# Patient Record
Sex: Male | Born: 1975 | Race: Black or African American | Hispanic: No | Marital: Single | Smoking: Current every day smoker
Health system: Southern US, Community
[De-identification: ages and names within clinical notes are randomized; demographics above are authoritative.]

## PROBLEM LIST (undated history)

## (undated) DIAGNOSIS — Z789 Other specified health status: Secondary | ICD-10-CM

---

## 2003-05-27 ENCOUNTER — Emergency Department (HOSPITAL_COMMUNITY): Admission: AC | Admit: 2003-05-27 | Discharge: 2003-05-27 | Payer: Self-pay

## 2003-06-03 ENCOUNTER — Emergency Department (HOSPITAL_COMMUNITY): Admission: EM | Admit: 2003-06-03 | Discharge: 2003-06-03 | Payer: Self-pay

## 2003-12-22 ENCOUNTER — Emergency Department (HOSPITAL_COMMUNITY): Admission: EM | Admit: 2003-12-22 | Discharge: 2003-12-22 | Payer: Self-pay | Admitting: Emergency Medicine

## 2004-07-18 ENCOUNTER — Emergency Department (HOSPITAL_COMMUNITY): Admission: EM | Admit: 2004-07-18 | Discharge: 2004-07-18 | Payer: Self-pay | Admitting: Emergency Medicine

## 2006-04-14 ENCOUNTER — Emergency Department (HOSPITAL_COMMUNITY): Admission: EM | Admit: 2006-04-14 | Discharge: 2006-04-14 | Payer: Self-pay | Admitting: Family Medicine

## 2006-11-04 ENCOUNTER — Emergency Department (HOSPITAL_COMMUNITY): Admission: EM | Admit: 2006-11-04 | Discharge: 2006-11-04 | Payer: Self-pay | Admitting: Emergency Medicine

## 2006-11-19 ENCOUNTER — Emergency Department (HOSPITAL_COMMUNITY): Admission: EM | Admit: 2006-11-19 | Discharge: 2006-11-19 | Payer: Self-pay | Admitting: Family Medicine

## 2006-12-21 ENCOUNTER — Ambulatory Visit: Payer: Self-pay | Admitting: Internal Medicine

## 2006-12-21 ENCOUNTER — Ambulatory Visit: Payer: Self-pay | Admitting: *Deleted

## 2007-01-22 DIAGNOSIS — K089 Disorder of teeth and supporting structures, unspecified: Secondary | ICD-10-CM | POA: Insufficient documentation

## 2008-04-16 ENCOUNTER — Emergency Department (HOSPITAL_COMMUNITY): Admission: EM | Admit: 2008-04-16 | Discharge: 2008-04-16 | Payer: Self-pay | Admitting: Emergency Medicine

## 2008-07-05 ENCOUNTER — Emergency Department (HOSPITAL_COMMUNITY): Admission: EM | Admit: 2008-07-05 | Discharge: 2008-07-05 | Payer: Self-pay | Admitting: Emergency Medicine

## 2010-03-15 ENCOUNTER — Emergency Department (HOSPITAL_COMMUNITY): Admission: EM | Admit: 2010-03-15 | Discharge: 2010-03-15 | Payer: Self-pay | Admitting: Emergency Medicine

## 2011-08-08 ENCOUNTER — Encounter (HOSPITAL_COMMUNITY): Payer: Self-pay | Admitting: *Deleted

## 2011-08-08 ENCOUNTER — Ambulatory Visit (HOSPITAL_BASED_OUTPATIENT_CLINIC_OR_DEPARTMENT_OTHER): Payer: Self-pay | Admitting: Anesthesiology

## 2011-08-08 ENCOUNTER — Encounter (HOSPITAL_BASED_OUTPATIENT_CLINIC_OR_DEPARTMENT_OTHER)
Admission: RE | Disposition: A | Discharge status: Home / Self Care | Payer: Self-pay | Source: Ambulatory Visit | Attending: Orthopedic Surgery

## 2011-08-08 ENCOUNTER — Encounter (HOSPITAL_BASED_OUTPATIENT_CLINIC_OR_DEPARTMENT_OTHER): Payer: Self-pay | Admitting: Anesthesiology

## 2011-08-08 ENCOUNTER — Ambulatory Visit (HOSPITAL_BASED_OUTPATIENT_CLINIC_OR_DEPARTMENT_OTHER)
Admission: RE | Admit: 2011-08-08 | Discharge: 2011-08-08 | Disposition: A | Discharge status: Home / Self Care | Payer: Self-pay | Source: Ambulatory Visit | Attending: Orthopedic Surgery | Admitting: Orthopedic Surgery

## 2011-08-08 ENCOUNTER — Encounter (HOSPITAL_BASED_OUTPATIENT_CLINIC_OR_DEPARTMENT_OTHER): Payer: Self-pay | Admitting: Orthopedic Surgery

## 2011-08-08 ENCOUNTER — Emergency Department (HOSPITAL_COMMUNITY)
Admission: EM | Admit: 2011-08-08 | Discharge: 2011-08-08 | Disposition: A | Discharge status: Home / Self Care | Payer: Self-pay | Attending: Emergency Medicine | Admitting: Emergency Medicine

## 2011-08-08 ENCOUNTER — Emergency Department (HOSPITAL_COMMUNITY): Payer: Self-pay

## 2011-08-08 DIAGNOSIS — W208XXA Other cause of strike by thrown, projected or falling object, initial encounter: Secondary | ICD-10-CM | POA: Insufficient documentation

## 2011-08-08 DIAGNOSIS — IMO0002 Reserved for concepts with insufficient information to code with codable children: Secondary | ICD-10-CM | POA: Insufficient documentation

## 2011-08-08 DIAGNOSIS — S68616A Complete traumatic transphalangeal amputation of right little finger, initial encounter: Secondary | ICD-10-CM

## 2011-08-08 HISTORY — PX: AMPUTATION: SHX166

## 2011-08-08 HISTORY — DX: Other specified health status: Z78.9

## 2011-08-08 SURGERY — AMPUTATION DIGIT
Anesthesia: Monitor Anesthesia Care | Site: Finger | Laterality: Left | Wound class: Contaminated

## 2011-08-08 MED ORDER — MIDAZOLAM HCL 5 MG/5ML IJ SOLN
INTRAMUSCULAR | Status: DC | PRN
  Administered 2011-08-08: 2 mg via INTRAVENOUS

## 2011-08-08 MED ORDER — BUPIVACAINE HCL (PF) 0.25 % IJ SOLN
INTRAMUSCULAR | Status: DC | PRN
  Administered 2011-08-08: 10 mL

## 2011-08-08 MED ORDER — PROPOFOL 10 MG/ML IV EMUL
INTRAVENOUS | Status: DC | PRN
  Administered 2011-08-08: 75 ug/kg/min via INTRAVENOUS

## 2011-08-08 MED ORDER — CEPHALEXIN 500 MG PO CAPS: 500.0000 mg | ORAL_CAPSULE | Freq: Four times a day (QID) | ORAL | Status: AC

## 2011-08-08 MED ORDER — CHLORHEXIDINE GLUCONATE 4 % EX LIQD: 60.0000 mL | Freq: Once | CUTANEOUS | Status: DC

## 2011-08-08 MED ORDER — TETANUS-DIPHTH-ACELL PERTUSSIS 5-2.5-18.5 LF-MCG/0.5 IM SUSP
0.5000 mL | Freq: Once | INTRAMUSCULAR | Status: AC
  Administered 2011-08-08: 0.5 mL via INTRAMUSCULAR
  Filled 2011-08-08: qty 0.5

## 2011-08-08 MED ORDER — OXYCODONE-ACETAMINOPHEN 5-325 MG PO TABS: ORAL_TABLET | ORAL | Status: AC

## 2011-08-08 MED ORDER — HYDROMORPHONE HCL PF 1 MG/ML IJ SOLN: 0.2500 mg | INTRAMUSCULAR | Status: DC | PRN

## 2011-08-08 MED ORDER — FENTANYL CITRATE 0.05 MG/ML IJ SOLN
INTRAMUSCULAR | Status: DC | PRN
  Administered 2011-08-08: 100 ug via INTRAVENOUS

## 2011-08-08 MED ORDER — OXYCODONE-ACETAMINOPHEN 5-325 MG PO TABS
1.0000 | ORAL_TABLET | Freq: Once | ORAL | Status: AC
  Administered 2011-08-08: 1 via ORAL
  Filled 2011-08-08: qty 1

## 2011-08-08 MED ORDER — CEFAZOLIN SODIUM 1-5 GM-% IV SOLN: 1.0000 g | INTRAVENOUS | Status: DC

## 2011-08-08 MED ORDER — OXYCODONE-ACETAMINOPHEN 5-325 MG PO TABS: 1.0000 | ORAL_TABLET | ORAL | Status: AC | PRN

## 2011-08-08 MED ORDER — CEPHALEXIN 250 MG PO CAPS
500.0000 mg | ORAL_CAPSULE | Freq: Once | ORAL | Status: AC
  Administered 2011-08-08: 500 mg via ORAL
  Filled 2011-08-08: qty 2

## 2011-08-08 MED ORDER — BUPIVACAINE HCL 0.25 % IJ SOLN
20.0000 mL | Freq: Once | INTRAMUSCULAR | Status: DC
  Filled 2011-08-08: qty 20

## 2011-08-08 MED ORDER — LACTATED RINGERS IV SOLN
INTRAVENOUS | Status: DC
  Administered 2011-08-08: 10:00:00 via INTRAVENOUS

## 2011-08-08 MED ORDER — ONDANSETRON HCL 4 MG/2ML IJ SOLN
INTRAMUSCULAR | Status: DC | PRN
  Administered 2011-08-08: 4 mg via INTRAVENOUS

## 2011-08-08 SURGICAL SUPPLY — 54 items
BANDAGE ELASTIC 3 VELCRO ST LF (GAUZE/BANDAGES/DRESSINGS) IMPLANT
BANDAGE GAUZE 4  KLING STR (GAUZE/BANDAGES/DRESSINGS) ×1 IMPLANT
BANDAGE GAUZE STRT 1 STR LF (GAUZE/BANDAGES/DRESSINGS) IMPLANT
BLADE MINI RND TIP GREEN BEAV (BLADE) IMPLANT
BLADE SURG 15 STRL LF DISP TIS (BLADE) ×2 IMPLANT
BLADE SURG 15 STRL SS (BLADE) ×4
BNDG CMPR 9X4 STRL LF SNTH (GAUZE/BANDAGES/DRESSINGS) ×1
BNDG CMPR MD 5X2 ELC HKLP STRL (GAUZE/BANDAGES/DRESSINGS)
BNDG COHESIVE 1X5 TAN STRL LF (GAUZE/BANDAGES/DRESSINGS) ×2 IMPLANT
BNDG ELASTIC 2 VLCR STRL LF (GAUZE/BANDAGES/DRESSINGS) IMPLANT
BNDG ESMARK 4X9 LF (GAUZE/BANDAGES/DRESSINGS) ×1 IMPLANT
CHLORAPREP W/TINT 26ML (MISCELLANEOUS) ×2 IMPLANT
CLOTH BEACON ORANGE TIMEOUT ST (SAFETY) ×2 IMPLANT
CORDS BIPOLAR (ELECTRODE) ×2 IMPLANT
COVER MAYO STAND STRL (DRAPES) ×2 IMPLANT
COVER TABLE BACK 60X90 (DRAPES) ×2 IMPLANT
DECANTER SPIKE VIAL GLASS SM (MISCELLANEOUS) ×1 IMPLANT
DRAIN PENROSE 1/4X12 LTX STRL (WOUND CARE) ×1 IMPLANT
DRAPE EXTREMITY T 121X128X90 (DRAPE) ×2 IMPLANT
DRAPE OEC MINIVIEW 54X84 (DRAPES) IMPLANT
DRAPE SURG 17X23 STRL (DRAPES) ×2 IMPLANT
GAUZE SPONGE 4X4 12PLY STRL LF (GAUZE/BANDAGES/DRESSINGS) IMPLANT
GAUZE XEROFORM 1X8 LF (GAUZE/BANDAGES/DRESSINGS) ×2 IMPLANT
GLOVE BIO SURGEON STRL SZ 6.5 (GLOVE) ×1 IMPLANT
GLOVE BIO SURGEON STRL SZ7.5 (GLOVE) ×2 IMPLANT
GLOVE BIOGEL PI IND STRL 7.0 (GLOVE) IMPLANT
GLOVE BIOGEL PI IND STRL 8 (GLOVE) ×1 IMPLANT
GLOVE BIOGEL PI INDICATOR 7.0 (GLOVE) ×1
GLOVE BIOGEL PI INDICATOR 8 (GLOVE) ×1
GOWN PREVENTION PLUS XLARGE (GOWN DISPOSABLE) ×2 IMPLANT
GOWN PREVENTION PLUS XXLARGE (GOWN DISPOSABLE) ×2 IMPLANT
GOWN STRL REIN XL XLG (GOWN DISPOSABLE) ×2 IMPLANT
NS IRRIG 1000ML POUR BTL (IV SOLUTION) ×2 IMPLANT
PACK BASIN DAY SURGERY FS (CUSTOM PROCEDURE TRAY) ×2 IMPLANT
PADDING CAST ABS 4INX4YD NS (CAST SUPPLIES) ×1
PADDING CAST ABS COTTON 4X4 ST (CAST SUPPLIES) ×1 IMPLANT
SPLINT FNGR BALL END 5/8X4.25 (SOFTGOODS) IMPLANT
SPLINT FNGR PLAIN END 5/8X3.25 (CAST SUPPLIES) IMPLANT
SPLINT PLASTALUME 3 1/4 (CAST SUPPLIES) ×2
SPLINT PLASTALUME BALL 4 1/4IN (SOFTGOODS)
SPONGE GAUZE 4X4 12PLY (GAUZE/BANDAGES/DRESSINGS) ×2 IMPLANT
STOCKINETTE 4X48 STRL (DRAPES) ×2 IMPLANT
SUT CHROMIC 4 0 P 3 18 (SUTURE) IMPLANT
SUT ETHILON 3 0 PS 1 (SUTURE) IMPLANT
SUT ETHILON 4 0 PS 2 18 (SUTURE) IMPLANT
SUT MERSILENE 4 0 P 3 (SUTURE) IMPLANT
SUT MON AB 5-0 P3 18 (SUTURE) ×1 IMPLANT
SUT VIC AB 4-0 P-3 18XBRD (SUTURE) IMPLANT
SUT VIC AB 4-0 P3 18 (SUTURE)
SYR BULB 3OZ (MISCELLANEOUS) ×2 IMPLANT
SYR CONTROL 10ML LL (SYRINGE) ×1 IMPLANT
TOWEL OR 17X24 6PK STRL BLUE (TOWEL DISPOSABLE) ×2 IMPLANT
UNDERPAD 30X30 INCONTINENT (UNDERPADS AND DIAPERS) ×2 IMPLANT
WATER STERILE IRR 1000ML POUR (IV SOLUTION) ×2 IMPLANT

## 2011-08-08 NOTE — Brief Op Note (Signed)
08/08/2011  11:57 AM  PATIENT:  Jeremiah Horton  36 y.o. male  PRE-OPERATIVE DIAGNOSIS:  FINGER TIP AMPUTATION  POST-OPERATIVE DIAGNOSIS:  Right Small Fingertip Amputation  PROCEDURE:  Procedure(s) (LRB): AMPUTATION DIGIT (Left)  SURGEON:  Surgeon(s) and Role:    * Tami Ribas, MD - Primary  PHYSICIAN ASSISTANT:   ASSISTANTS: none   ANESTHESIA:   local and MAC  EBL:     BLOOD ADMINISTERED:none  DRAINS: none   LOCAL MEDICATIONS USED:  MARCAINE     SPECIMEN:  No Specimen  DISPOSITION OF SPECIMEN:  N/A  COUNTS:  YES  TOURNIQUET:  * Missing tourniquet times found for documented tourniquets in log:  34500 *  DICTATION: .Other Dictation: Dictation Number [id number]  PLAN OF CARE: Discharge to home after PACU  PATIENT DISPOSITION:  PACU - hemodynamically stable.

## 2011-08-08 NOTE — Transfer of Care (Signed)
Immediate Anesthesia Transfer of Care Note  Patient: Jeremiah Horton  Procedure(s) Performed: Procedure(s) (LRB): AMPUTATION DIGIT (Left)  Patient Location: PACU  Anesthesia Type: MAC  Level of Consciousness: awake  Airway & Oxygen Therapy: Patient Spontanous Breathing and Patient connected to face mask oxygen  Post-op Assessment: Report given to PACU RN and Post -op Vital signs reviewed and stable  Post vital signs: Reviewed and stable  Complications: No apparent anesthesia complications

## 2011-08-08 NOTE — Op Note (Signed)
Dictation [id number]

## 2011-08-08 NOTE — ED Notes (Signed)
Patient states he was reaching under the hood of the car and cut his pinky finger on the right hand.  Dressing on at this time

## 2011-08-08 NOTE — Anesthesia Preprocedure Evaluation (Signed)
Anesthesia Evaluation  Patient identified by MRN, date of birth, ID band Patient awake    Reviewed: Allergy & Precautions, H&P , NPO status , Patient's Chart, lab work & pertinent test results  History of Anesthesia Complications Negative for: history of anesthetic complications  Airway Mallampati: II TM Distance: >3 FB Neck ROM: Full    Dental No notable dental hx. (+) Teeth Intact, Implants and Dental Advisory Given   Pulmonary Current Smoker,  breath sounds clear to auscultation  Pulmonary exam normal       Cardiovascular negative cardio ROS  Rhythm:Regular Rate:Normal     Neuro/Psych negative neurological ROS  negative psych ROS   GI/Hepatic negative GI ROS, Neg liver ROS,   Endo/Other  negative endocrine ROS  Renal/GU negative Renal ROS     Musculoskeletal   Abdominal   Peds  Hematology negative hematology ROS (+)   Anesthesia Other Findings   Reproductive/Obstetrics                           Anesthesia Physical Anesthesia Plan  ASA: II  Anesthesia Plan: MAC   Post-op Pain Management:    Induction:   Airway Management Planned: Simple Face Mask  Additional Equipment:   Intra-op Plan:   Post-operative Plan:   Informed Consent: I have reviewed the patients History and Physical, chart, labs and discussed the procedure including the risks, benefits and alternatives for the proposed anesthesia with the patient or authorized representative who has indicated his/her understanding and acceptance.   Dental advisory given  Plan Discussed with: CRNA and Surgeon  Anesthesia Plan Comments: (Plan routine monitors, MAC)        Anesthesia Quick Evaluation

## 2011-08-08 NOTE — Discharge Instructions (Addendum)

## 2011-08-08 NOTE — H&P (Signed)
  Jeremiah Horton is an 36 y.o. male.   Chief Complaint: right small fingertip amputation HPI: 36 yo male amputated tip of right small finger in radiator fan of his car last night.  Seen at [facility] where wound was cleaned and dressed.  Presents to surgery center for care.  Reports no previous injury to right small finger and no other injury at this time.    No past medical history on file.  No past surgical history on file.  No family history on file. Social History:  reports that he has been smoking.  He does not have any smokeless tobacco history on file. He reports that he drinks alcohol. He reports that he does not use illicit drugs.  Allergies: No Known Allergies  Medications Prior to Admission  Medication Dose Route Frequency Provider Last Rate Last Dose  . cephALEXin (KEFLEX) capsule 500 mg  500 mg Oral Once Nat Christen, MD   500 mg at 08/08/11 0500  . oxyCODONE-acetaminophen (PERCOCET) 5-325 MG per tablet 1 tablet  1 tablet Oral Once Nat Christen, MD   1 tablet at 08/08/11 0420  . TDaP (BOOSTRIX) injection 0.5 mL  0.5 mL Intramuscular Once Nat Christen, MD   0.5 mL at 08/08/11 0421  . DISCONTD: bupivacaine (MARCAINE) 0.25 % (with pres) injection 20 mL  20 mL Other Once Nat Christen, MD       No current outpatient prescriptions on file as of 08/08/2011.    No results found for this or any previous visit (from the past 48 hour(s)).  Dg Finger Little Right  08/08/2011  *RADIOLOGY REPORT*  Clinical Data: Amputation of the distal tip of the right fifth finger.  RIGHT LITTLE FINGER 2+V  Comparison: None.  Findings: There is avulsion of a portion of the distal tuft of the fifth distal phalanx; the distal fragment is not seen on this study.  Associated loss of the overlying soft tissues is noted.  Visualized joint spaces are preserved.  No additional fractures are seen.  IMPRESSION: Avulsion of a portion of the distal tuft of the fifth distal phalanx and associated soft  tissues; the distal osseous fragment is not seen on this study.  Original Report Authenticated By: Tonia Ghent, M.D.     A comprehensive review of systems was negative.  There were no vitals taken for this visit.  General appearance: alert, cooperative and appears stated age Head: Normocephalic, without obvious abnormality, atraumatic Neck: supple, symmetrical, trachea midline Resp: clear to auscultation bilaterally Cardio: regular rate and rhythm GI: soft, non-tender; bowel sounds normal; no masses,  no organomegaly Extremities: light touch sensation and capillary refill intact all digits.  +epl/fpl/io.  left ue: no wounds, no ttp.  right ue: amputation of tip of small finger through distal aspect of nail.  no other wounds or ttp.   Pulses: 2+ and symmetric Skin: as above Neurologic: Grossly normal Incision/Wound: As above  Assessment/Plan Right small fingertip amputation.  Discussed nature of injury with patient.  Recommend OR for I&D and revision amputation.  Risks, benefits, and alternatives of surgery were discussed and the patient agrees with the plan of care.   Shavonn Convey R 08/08/2011, 9:49 AM

## 2011-08-08 NOTE — Anesthesia Postprocedure Evaluation (Signed)
  Anesthesia Post-op Note  Patient: Jeremiah Horton  Procedure(s) Performed: Procedure(s) (LRB): AMPUTATION DIGIT (Left)  Patient Location: PACU  Anesthesia Type: MAC  Level of Consciousness: awake, alert  and oriented  Airway and Oxygen Therapy: Patient Spontanous Breathing  Post-op Pain: none  Post-op Assessment: Post-op Vital signs reviewed, Patient's Cardiovascular Status Stable, Respiratory Function Stable, Patent Airway, No signs of Nausea or vomiting, Adequate PO intake and Pain level controlled  Post-op Vital Signs: Reviewed and stable  Complications: No apparent anesthesia complications

## 2011-08-08 NOTE — ED Provider Notes (Addendum)
History     CSN: [id number]  Arrival date & time 08/08/11  4098   First MD Initiated Contact with Patient 08/08/11 [phone]      Chief Complaint  Patient presents with  . Hand Injury    (Consider location/radiation/quality/duration/timing/severity/associated sxs/prior treatment) HPI Comments: Patient presents with a right little finger injury.  He states his he stuck his hand under the hood and it was cut off.  He denies any other injuries.  His tetanus immunization is not up-to-date.  He is right-hand dominant.  Pain is worse with palpation.  It is better when left alone.  Patient is a 36 y.o. male presenting with hand injury. The history is provided by the patient. No language interpreter was used.  Hand Injury  The incident occurred less than 1 hour ago. The incident occurred at work. Pertinent negatives include no fever.    History reviewed. No pertinent past medical history.  History reviewed. No pertinent past surgical history.  History reviewed. No pertinent family history.  History  Substance Use Topics  . Smoking status: Current Everyday Smoker  . Smokeless tobacco: Not on file  . Alcohol Use: Yes      Review of Systems  Constitutional: Negative.  Negative for fever and chills.  HENT: Negative.   Eyes: Negative.  Negative for discharge and redness.  Respiratory: Negative.  Negative for cough and shortness of breath.   Cardiovascular: Negative.  Negative for chest pain.  Gastrointestinal: Negative.  Negative for nausea, vomiting and abdominal pain.  Genitourinary: Negative.  Negative for hematuria.  Musculoskeletal: Negative for back pain.  Skin: Negative.  Negative for color change and rash.  Neurological: Negative for syncope and headaches.  Hematological: Negative.  Negative for adenopathy.  Psychiatric/Behavioral: Negative.  Negative for confusion.  All other systems reviewed and are negative.    Allergies  Review of patient's allergies indicates no  known allergies.  Home Medications   Current Outpatient Rx  Name Route Sig Dispense Refill  . ADULT MULTIVITAMIN W/MINERALS CH Oral Take 1 tablet by mouth daily.      BP 158/93  Pulse 91  Temp(Src) 98.5 F (36.9 C) (Oral)  Resp 16  SpO2 98%  Physical Exam  Nursing note and vitals reviewed. Constitutional: He is oriented to person, place, and time. He appears well-developed and well-nourished.  Non-toxic appearance. He does not have a sickly appearance.  HENT:  Head: Normocephalic and atraumatic.  Eyes: Conjunctivae, EOM and lids are normal. Pupils are equal, round, and reactive to light.  Neck: Trachea normal, normal range of motion and full passive range of motion without pain. Neck supple.  Cardiovascular: Normal rate.   Pulmonary/Chest: Effort normal.  Abdominal: Normal appearance. There is no CVA tenderness.  Musculoskeletal: Normal range of motion.       Right little finger has a amputation through the middle of the distal phalanx.  The base of the nail is still intact.  There is no other laceration to the hand.  Minimal bleeding at this time.  Neurological: He is alert and oriented to person, place, and time. He has normal strength.  Skin: Skin is warm, dry and intact. No rash noted.  Psychiatric: He has a normal mood and affect. His behavior is normal. Judgment and thought content normal.    ED Course  Procedures (including critical care time)  Dg Finger Little Right  08/08/2011  *RADIOLOGY REPORT*  Clinical Data: Amputation of the distal tip of the right fifth finger.  RIGHT LITTLE FINGER  2+V  Comparison: None.  Findings: There is avulsion of a portion of the distal tuft of the fifth distal phalanx; the distal fragment is not seen on this study.  Associated loss of the overlying soft tissues is noted.  Visualized joint spaces are preserved.  No additional fractures are seen.  IMPRESSION: Avulsion of a portion of the distal tuft of the fifth distal phalanx and associated  soft tissues; the distal osseous fragment is not seen on this study.  Original Report Authenticated By: Tonia Ghent, M.D.      MDM  Patient had his little finger anesthetized with a digital block with 2% lidocaine without epinephrine.  I then irrigated his finger with 800 mL of normal saline.  The wound appeared clean without foreign bodies.  It was dressed with bacitracin and petroleum gauze.  He'll have an aluminum splint placed for protection of the finger as well.  I'm going to discuss the case with Dr. Mina Marble for followup for this patient as well.  Patient will be discharged with pain medications and Keflex for antibiotics.  His tetanus immunization was updated today as well.        Nat Christen, MD 08/08/11 0451  Dr. Merlyn Lot was actually covering for hand surgery and I spoke with him.  He will evaluate the patient [facility] 8[facility]  He is requested the patient remained n.p.o. between now and then.  I've instructed the patient of this and he understands.  Nat Christen, MD 08/08/11 [phone]

## 2011-08-08 NOTE — Op Note (Signed)
NAMEJARAMIAH, Jeremiah Horton NO.:  [id number]  MEDICAL RECORD NO.:  [id number]  LOCATION:  [id number]                        FACILITY:  [facility]  PHYSICIAN:  Betha Loa, MD        DATE OF BIRTH:  Jun 01, 1975  DATE OF PROCEDURE:  08/08/2011 DATE OF DISCHARGE:  08/08/2011                              OPERATIVE REPORT   PREOPERATIVE DIAGNOSIS:  Right small fingertip amputation.  POSTOPERATIVE DIAGNOSIS:  Right small fingertip amputation.  PROCEDURE:  Revision amputation, right long fingertip.  SURGEON:  Betha Loa, MD  ASSISTANTS:  None.  ANESTHESIA:  Sedation with a digital block.  INTRAVENOUS FLUIDS:  Per anesthesia flow sheet.  ESTIMATED BLOOD LOSS:  Minimal.  COMPLICATIONS:  None.  SPECIMENS:  None.  TOURNIQUET TIME:  20 minutes.  DISPOSITION:  Stable to PACU.  INDICATIONS:  Jeremiah Horton is a 36 year old male who last night was reaching into his engine bay of his car when the radiator fan amputated the tip of the right small finger.  He was seen at the [facility] where he was evaluated.  The wound was cleaned and he was placed in dressing.  He followed up at [facility] for care.  On evaluation, he had complete amputation at the tip of the right small finger with exposed bone.  Recommended Jeremiah Horton going to the operating room for irrigation, debridement, and revision amputation. Risks, benefits, and alternatives of surgery were reviewed including the risk of blood loss, infection, damage to nerves, vessels, tendons, ligaments, and bone, failure of surgery, need for additional surgery, complications with wound healing, continued pain, and nail deformity. He voiced understanding these risks and elected to proceed.  OPERATIVE COURSE:  After being identified preoperatively by myself, the Patient and I agreed upon procedure and site of procedure.  Surgical site was marked.  The risks, benefits, and alternatives of surgery  were reviewed and he wished to proceed.  Surgical consent had been signed.  He was given 2 g of IV Ancef as preoperative antibiotic prophylaxis and for coverage of the injury.  He was transported to the operating room and placed on the operating table in the supine position with the right upper extremity on an arm board.  Sedation was induced by the anesthesiologist.  A surgical pause was performed between surgeons, anesthesia, and operating staff and all were in agreement as to the patient, procedure, and site of procedure.  A digital block was performed with 10 mL of 0.25% plain Marcaine.  The right upper extremity was prepped and draped in a normal sterile orthopedic fashion.  A surgical pause had been performed.  A Penrose drain was used as a tourniquet and it was up for 20 minutes.  The wound was debrided of hematoma.  There was no gross contamination.  It was copiously irrigated with sterile saline.  The soft tissues were freed up around the distal phalanx using the #15 blade.  The nail had been removed.  The rongeurs were used to shorten the bone and the rasp was used to smooth the edges. This allowed the soft tissue to be brought over the end of the bone.  A 5-0 Monocryl  suture was used to secure the soft tissue over the end of the bone.  This was adequate to oppose the soft tissue edges.  The wound was dressed with sterile Xeroform.  A piece of Xeroform was placed in nail fold.  The wound was dressed with 4 x 4's and wrapped with a Kling bandage.  A Alumafoam splint was placed and wrapped with a Kling and a Coban lightly.  The Penrose drain was removed.  The operative drapes were broken down and the patient was awoken from sedation safely.  He was transferred back to stretcher and taken to PACU in stable condition. I will see him back in the office in 1 week for postoperative followup. I will give him Percocet 5/325 one to two p.o. q.6 hours p.r.n. pain, dispensed  #40.     Betha Loa, MD     KK/MEDQ  D:  08/08/2011  T:  08/08/2011  Job:  [id number]

## 2011-08-08 NOTE — ED Notes (Addendum)
Per PT: pt stuck hand in car hood and cut pinky finger on right hand. Finger tip missing from pinky, bleeding controlled with dressing. Unknown tetnaus date.

## 2011-08-08 NOTE — Discharge Instructions (Signed)
Please go to the [facility] at 8:30 this morning to be seen by Dr. Merlyn Lot.  He is expecting to see you for reevaluation of your finger injury.  Do not eat or drink anything between now and then.  Fingertip Injuries and Amputations Fingertip injuries are common and often get injured because they are last to escape when pulling your hand out of harm's way. You have amputated (cut off) part of your finger. How this turns out depends largely on how much was amputated. If just the tip is amputated, often the end of the finger will grow back and the finger may return to much the same as it was before the injury.  If more of the finger is missing, your caregiver has done the best with the tissue remaining to allow you to keep as much finger as is possible. Your caregiver after checking your injury has tried to leave you with a painless fingertip that has durable, feeling skin. If possible, your caregiver has tried to maintain the finger's length and appearance and preserve its fingernail.  Please read the instructions outlined below and refer to this sheet in the next few weeks. These instructions provide you with general information on caring for yourself. Your caregiver may also give you specific instructions. While your treatment has been done according to the most current medical practices available, unavoidable complications occasionally occur. If you have any problems or questions after discharge, please call your caregiver. HOME CARE INSTRUCTIONS   You may resume normal diet and activities as directed or allowed.   Keep your hand elevated above the level of your heart. This helps decrease pain and swelling.   Keep ice packs (or a bag of ice wrapped in a towel) on the injured area for 15 to 20 minutes, 3 to 4 times per day, for the first two days.   Change dressings if necessary or as directed.   Clean the wound daily or as directed.   Only take over-the-counter or prescription  medicines for pain, discomfort, or fever as directed by your caregiver.   Keep appointments as directed.  SEEK IMMEDIATE MEDICAL CARE IF:  You develop redness, swelling, numbness or increasing pain in the wound.   There is pus coming from the wound.   You develop an unexplained oral temperature above 102 F (38.9 C) or as your caregiver suggests.   There is a foul (bad) smell coming from the wound or dressing.   There is a breaking open of the wound (edges not staying together) after sutures or staples have been removed.  MAKE SURE YOU:   Understand these instructions.   Will watch your condition.   Will get help right away if you are not doing well or get worse.  Document Released: 03/02/2005 Document Revised: 03/31/2011 Document Reviewed: 01/30/2008 [facility] Patient Information 2012 [place], [place].

## 2011-08-09 ENCOUNTER — Encounter (HOSPITAL_BASED_OUTPATIENT_CLINIC_OR_DEPARTMENT_OTHER): Payer: Self-pay | Admitting: Orthopedic Surgery

## 2011-08-12 ENCOUNTER — Encounter (HOSPITAL_COMMUNITY): Payer: Self-pay

## 2012-12-27 ENCOUNTER — Encounter (HOSPITAL_COMMUNITY): Payer: Self-pay | Admitting: Emergency Medicine

## 2012-12-27 ENCOUNTER — Emergency Department (HOSPITAL_COMMUNITY)
Admission: EM | Admit: 2012-12-27 | Discharge: 2012-12-27 | Disposition: A | Discharge status: Home / Self Care | Payer: Self-pay | Attending: Emergency Medicine | Admitting: Emergency Medicine

## 2012-12-27 DIAGNOSIS — K529 Noninfective gastroenteritis and colitis, unspecified: Secondary | ICD-10-CM

## 2012-12-27 DIAGNOSIS — K5289 Other specified noninfective gastroenteritis and colitis: Secondary | ICD-10-CM | POA: Insufficient documentation

## 2012-12-27 DIAGNOSIS — K089 Disorder of teeth and supporting structures, unspecified: Secondary | ICD-10-CM | POA: Insufficient documentation

## 2012-12-27 DIAGNOSIS — K0889 Other specified disorders of teeth and supporting structures: Secondary | ICD-10-CM

## 2012-12-27 DIAGNOSIS — F17200 Nicotine dependence, unspecified, uncomplicated: Secondary | ICD-10-CM | POA: Insufficient documentation

## 2012-12-27 DIAGNOSIS — R112 Nausea with vomiting, unspecified: Secondary | ICD-10-CM

## 2012-12-27 DIAGNOSIS — K029 Dental caries, unspecified: Secondary | ICD-10-CM | POA: Insufficient documentation

## 2012-12-27 MED ORDER — ONDANSETRON HCL 4 MG PO TABS
4.0000 mg | ORAL_TABLET | Freq: Once | ORAL | Status: AC
  Administered 2012-12-27: 4 mg via ORAL
  Filled 2012-12-27: qty 1

## 2012-12-27 NOTE — ED Provider Notes (Signed)
CSN: [id number]     Arrival date & time 12/27/12  1913 History   First MD Initiated Contact with Patient 12/27/12 2011     Chief Complaint  Patient presents with  . Emesis   (Consider location/radiation/quality/duration/timing/severity/associated sxs/prior Treatment) HPI Comments: 37 year old healthy male presents to the emergency department complaining of nausea and vomiting beginning around 1:00 this afternoon. Patient states he was at [facility], had a [place] burger and bit down into something metal. He brought the piece of metal in to the emergency department with him, it is about the size of 1 mm. Immediately after he swallowed the burger he began vomiting. Had multiple episodes of vomiting while at the restaurant, went home and the vomiting subsided. Went to sleep, woke up still feeling nauseated. He has not tried any alleviating factors. States he had one episode of diarrhea when he woke up about one hour prior to arrival at the emergency department. Denies associated abdominal pain, fever or chills. Also complaining of left lower dental pain and believes he has an infection is requesting an antibiotic. States the pain is intermittent, worse when a fan blows onto the left side of his face. He does not have a dentist.  Patient is a 37 y.o. male presenting with vomiting. The history is provided by the patient.  Emesis Associated symptoms: diarrhea   Associated symptoms: no abdominal pain and no chills     Past Medical History  Diagnosis Date  . No pertinent past medical history    Past Surgical History  Procedure Laterality Date  . No past surgeries    . Amputation  08/08/2011    Procedure: AMPUTATION DIGIT;  Surgeon: Tami Ribas, MD;  Location: [facility];  Service: Orthopedics;  Laterality: Left;  REVISION AMPUTATION LEFT SMALL FINGER   History reviewed. No pertinent family history. History  Substance Use Topics  . Smoking status: Current Every Day Smoker -- 1.00  packs/day    Types: Cigarettes  . Smokeless tobacco: Never Used  . Alcohol Use: Yes     Comment: SOCIAL    Review of Systems  Constitutional: Positive for appetite change. Negative for fever and chills.  HENT: Positive for dental problem. Negative for trouble swallowing and voice change.   Respiratory: Negative for shortness of breath.   Cardiovascular: Negative for chest pain.  Gastrointestinal: Positive for nausea, vomiting and diarrhea. Negative for abdominal pain and blood in stool.  Musculoskeletal: Negative for back pain.  Neurological: Negative for weakness.  All other systems reviewed and are negative.    Allergies  Review of patient's allergies indicates no known allergies.  Home Medications  No current outpatient prescriptions on file. BP 131/91  Pulse 79  Temp(Src) 98.1 F (36.7 C) (Oral)  Resp 16  Ht 5\' 11"  (1.803 m)  Wt 195 lb (88.451 kg)  BMI 27.21 kg/m2  SpO2 99% Physical Exam  Nursing note and vitals reviewed. Constitutional: He is oriented to person, place, and time. He appears well-developed and well-nourished. No distress.  HENT:  Head: Normocephalic and atraumatic.  Mouth/Throat: Uvula is midline, oropharynx is clear and moist and mucous membranes are normal. Dental caries present.    Left second and third lower molar tender to palpation, no surrounding erythema, edema or abscess.  Eyes: Conjunctivae are normal.  Neck: Normal range of motion. Neck supple.  Cardiovascular: Normal rate, regular rhythm and normal heart sounds.   Pulmonary/Chest: Effort normal and breath sounds normal.  Abdominal: Soft. Normal appearance and bowel sounds are  normal. He exhibits no distension and no mass. There is no tenderness. There is no rebound and no guarding.  Musculoskeletal: Normal range of motion. He exhibits no edema.  Lymphadenopathy:       Head (right side): No submental, no submandibular and no tonsillar adenopathy present.       Head (left side): No  submental, no submandibular and no tonsillar adenopathy present.    He has no cervical adenopathy.  Neurological: He is alert and oriented to person, place, and time.  Skin: Skin is warm and dry. He is not diaphoretic.  Psychiatric: He has a normal mood and affect. His behavior is normal.    ED Course  Procedures (including critical care time) Labs Review Labs Reviewed - No data to display Imaging Review No results found.  MDM   1. Nausea and vomiting   2. Gastroenteritis      Patient with nausea and vomiting after eating a [place] burger. No vomiting in the emergency department. He is well appearing with normal vital signs and in no apparent distress. Abdomen is soft, nontender. I do not feel labs necessary at this time. I will give him Zofran for nausea and PO challenge. Regarding his dental pain, no signs of infection or abscess. I will refer him to the dentist. 9:51 PM Patient reports improvement of his nausea with zofran, tolerating PO fluids, crackers. States he is ready to go home. He is stable for discharge. Conservative measures discussed. I stressed importance of f/u with dentist. Return precautions discussed. Patient states understanding of treatment care plan and is agreeable.   Trevor Mace, PA-C 12/27/12 2152

## 2012-12-27 NOTE — ED Notes (Signed)
Pt stated that he had a [place] burger around 1400 and found a metal piece in the burger.  Pt stated he immediately became nauseated and has been vomiting ever since.  Pt stated he still feels nauseated and has the metal piece with him.

## 2012-12-27 NOTE — ED Provider Notes (Signed)
Medical screening examination/treatment/procedure(s) were performed by non-physician practitioner and as supervising physician I was immediately available for consultation/collaboration.  Idonna Heeren, MD 12/27/12 2339 

## 2017-10-20 ENCOUNTER — Other Ambulatory Visit: Payer: Self-pay

## 2017-10-20 ENCOUNTER — Emergency Department (HOSPITAL_COMMUNITY): Payer: Self-pay

## 2017-10-20 ENCOUNTER — Encounter (HOSPITAL_COMMUNITY): Payer: Self-pay

## 2017-10-20 ENCOUNTER — Emergency Department (HOSPITAL_COMMUNITY)
Admission: EM | Admit: 2017-10-20 | Discharge: 2017-10-20 | Disposition: A | Discharge status: Home / Self Care | Payer: Self-pay | Attending: Emergency Medicine | Admitting: Emergency Medicine

## 2017-10-20 DIAGNOSIS — F17210 Nicotine dependence, cigarettes, uncomplicated: Secondary | ICD-10-CM | POA: Insufficient documentation

## 2017-10-20 DIAGNOSIS — R05 Cough: Secondary | ICD-10-CM | POA: Insufficient documentation

## 2017-10-20 DIAGNOSIS — J069 Acute upper respiratory infection, unspecified: Secondary | ICD-10-CM | POA: Insufficient documentation

## 2017-10-20 MED ORDER — ACETAMINOPHEN 325 MG PO TABS
650.0000 mg | ORAL_TABLET | Freq: Once | ORAL | Status: AC
  Administered 2017-10-20: 650 mg via ORAL
  Filled 2017-10-20: qty 2

## 2017-10-20 NOTE — ED Provider Notes (Signed)
[facility]-EMERGENCY DEPT Provider Note   CSN: [id number] Arrival date & time: 10/20/17  0908     History   Chief Complaint Chief Complaint  Patient presents with  . Fever  . Cough    HPI Jeremiah Horton is a 42 y.o. male.  HPI Patient presented to the emergency room for evaluation of fever and cough.  Patient states yesterday he started to have some generalized malaise.  He also had some lower abdominal cramping.  He did not have much of an appetite.  Those symptoms resolved and his abdomen is no longer bothering him.  This morning however he woke up with a temperature of 102.  He has had some nasal congestion as well as a cough.  He denies any sore throat or earache.  No rashes.  No recent trips or travel.  His child was sick recently but did not have a fever. Past Medical History:  Diagnosis Date  . No pertinent past medical history     Patient Active Problem List   Diagnosis Date Noted  . 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 AMPUTATION  08/08/2011   Procedure: AMPUTATION DIGIT;  Surgeon: Tami RibasKevin R Kuzma, MD;  Location: [facility];  Service: Orthopedics;  Laterality: Left;  REVISION AMPUTATION LEFT SMALL FINGER        Home Medications    Prior to Admission medications   Medication Sig Start Date End Date Taking? Authorizing Provider  Phenylephrine-Pheniramine-DM [facility](THERAFLU COLD & COUGH PO) Take by mouth See admin instructions. Pt could not find plastic cup so took 2 tbsp   Yes [provider]    Family History Family History  Problem Relation Age of Onset  . Cancer Mother     Social History Social History   Tobacco Use  . Smoking status: Current Every Day Smoker    Packs/day: 0.25    Types: Cigarettes  . Smokeless tobacco: Never Used  Substance Use Topics  . Alcohol use: Yes    Comment: SOCIAL  . Drug use: No      Allergies   Patient has no known allergies.   Review of Systems Review of Systems  All other systems reviewed and are negative.    Physical Exam Updated Vital Signs BP (!) 109/94 (BP Location: Right Arm)   Pulse (!) 107   Temp 99.4 F (37.4 C) (Oral)   Resp 16   Ht 1.829 m (6')   Wt 43.1 kg (95 lb)   SpO2 100%   BMI 12.88 kg/m   Physical Exam  Constitutional: He appears well-developed and well-nourished. No distress.  HENT:  Head: Normocephalic and atraumatic.  Right Ear: Tympanic membrane and external ear normal.  Left Ear: Tympanic membrane and external ear normal.  Mouth/Throat: Oropharynx is clear and moist.  Eyes: Conjunctivae are normal. Right eye exhibits no discharge. Left eye exhibits no discharge. No scleral icterus.  Neck: Neck supple. No tracheal deviation present.  Cardiovascular: Normal rate, regular rhythm and intact distal pulses.  Pulmonary/Chest: Effort normal. No stridor. No respiratory distress. He has no wheezes. He has no rales.  Few rhonchi noted  Abdominal: Soft. Bowel sounds are normal. He exhibits no distension. There is no tenderness. There is no rebound and no guarding.  Musculoskeletal: He exhibits no edema or tenderness.  Lymphadenopathy:    He has no cervical adenopathy.  Neurological: He is alert. He has normal strength.  No cranial nerve deficit (no facial droop, extraocular movements intact, no slurred speech) or sensory deficit. He exhibits normal muscle tone. He displays no seizure activity. Coordination normal.  Skin: Skin is warm and dry. No rash noted.  Psychiatric: He has a normal mood and affect.  Nursing note and vitals reviewed.    ED Treatments / Results   Radiology Dg Chest 2 View  Result Date: 10/20/2017 CLINICAL DATA:  Cough and fever for 2 days.  Smoker. EXAM: CHEST - 2 VIEW COMPARISON:  04/14/2006 FINDINGS: The cardiomediastinal silhouette is within normal limits. The lungs are well inflated and clear. There is  no evidence of pleural effusion or pneumothorax. No acute osseous abnormality is identified. IMPRESSION: No active cardiopulmonary disease. Electronically Signed   By: Sebastian Ache M.D.   On: 10/20/2017 10:38    Procedures Procedures (including critical care time)  Medications Ordered in ED Medications - No data to display   Initial Impression / Assessment and Plan / ED Course  I have reviewed the triage vital signs and the nursing notes.  Pertinent labs & imaging results that were available during my care of the patient were reviewed by me and considered in my medical decision making (see chart for details).    Symptoms are consistent with a viral  upper respiratory infection. There is no evidence to suggest pneumonia on my exam or on xray. The patient does not appear to have an otitis media. I discussed supportive treatment. I encouraged followup with the primary care doctor next week if symptoms have not resolved. Warning signs and reasons to return to the emergency room were discussed '  Final Clinical Impressions(s) / ED Diagnoses   Final diagnoses:  Upper respiratory tract infection, unspecified type    ED Discharge Orders    None       Linwood Dibbles, MD 10/20/17 1137

## 2017-10-20 NOTE — Discharge Instructions (Addendum)
Take over-the-counter Tylenol or ibuprofen as needed for fever and aches, follow-up with a primary care doctor the symptoms are not resolving by next week, return as needed for worsening symptoms

## 2017-10-20 NOTE — ED Triage Notes (Signed)
Patient c/o productive cough with yellow sputum and a fever since yesterday. Patient states his temp at home was 102.6.

## 2017-10-20 NOTE — ED Notes (Signed)
Patient verbalized understanding of discharge instructions, no questions. Patients temp 102.3, MD informed, patient given tylenol prior to departure. Patient ambulated out of ED with steady gait in no distress.

## 2019-11-01 IMAGING — CR DG CHEST 2V
2 series · 2 of 2 positions shown · non-contrast
Comparison: 04/14/2006

CLINICAL DATA: Cough and fever for 2 days.  Smoker.

EXAM:
CHEST - 2 VIEW

[w chest pa]
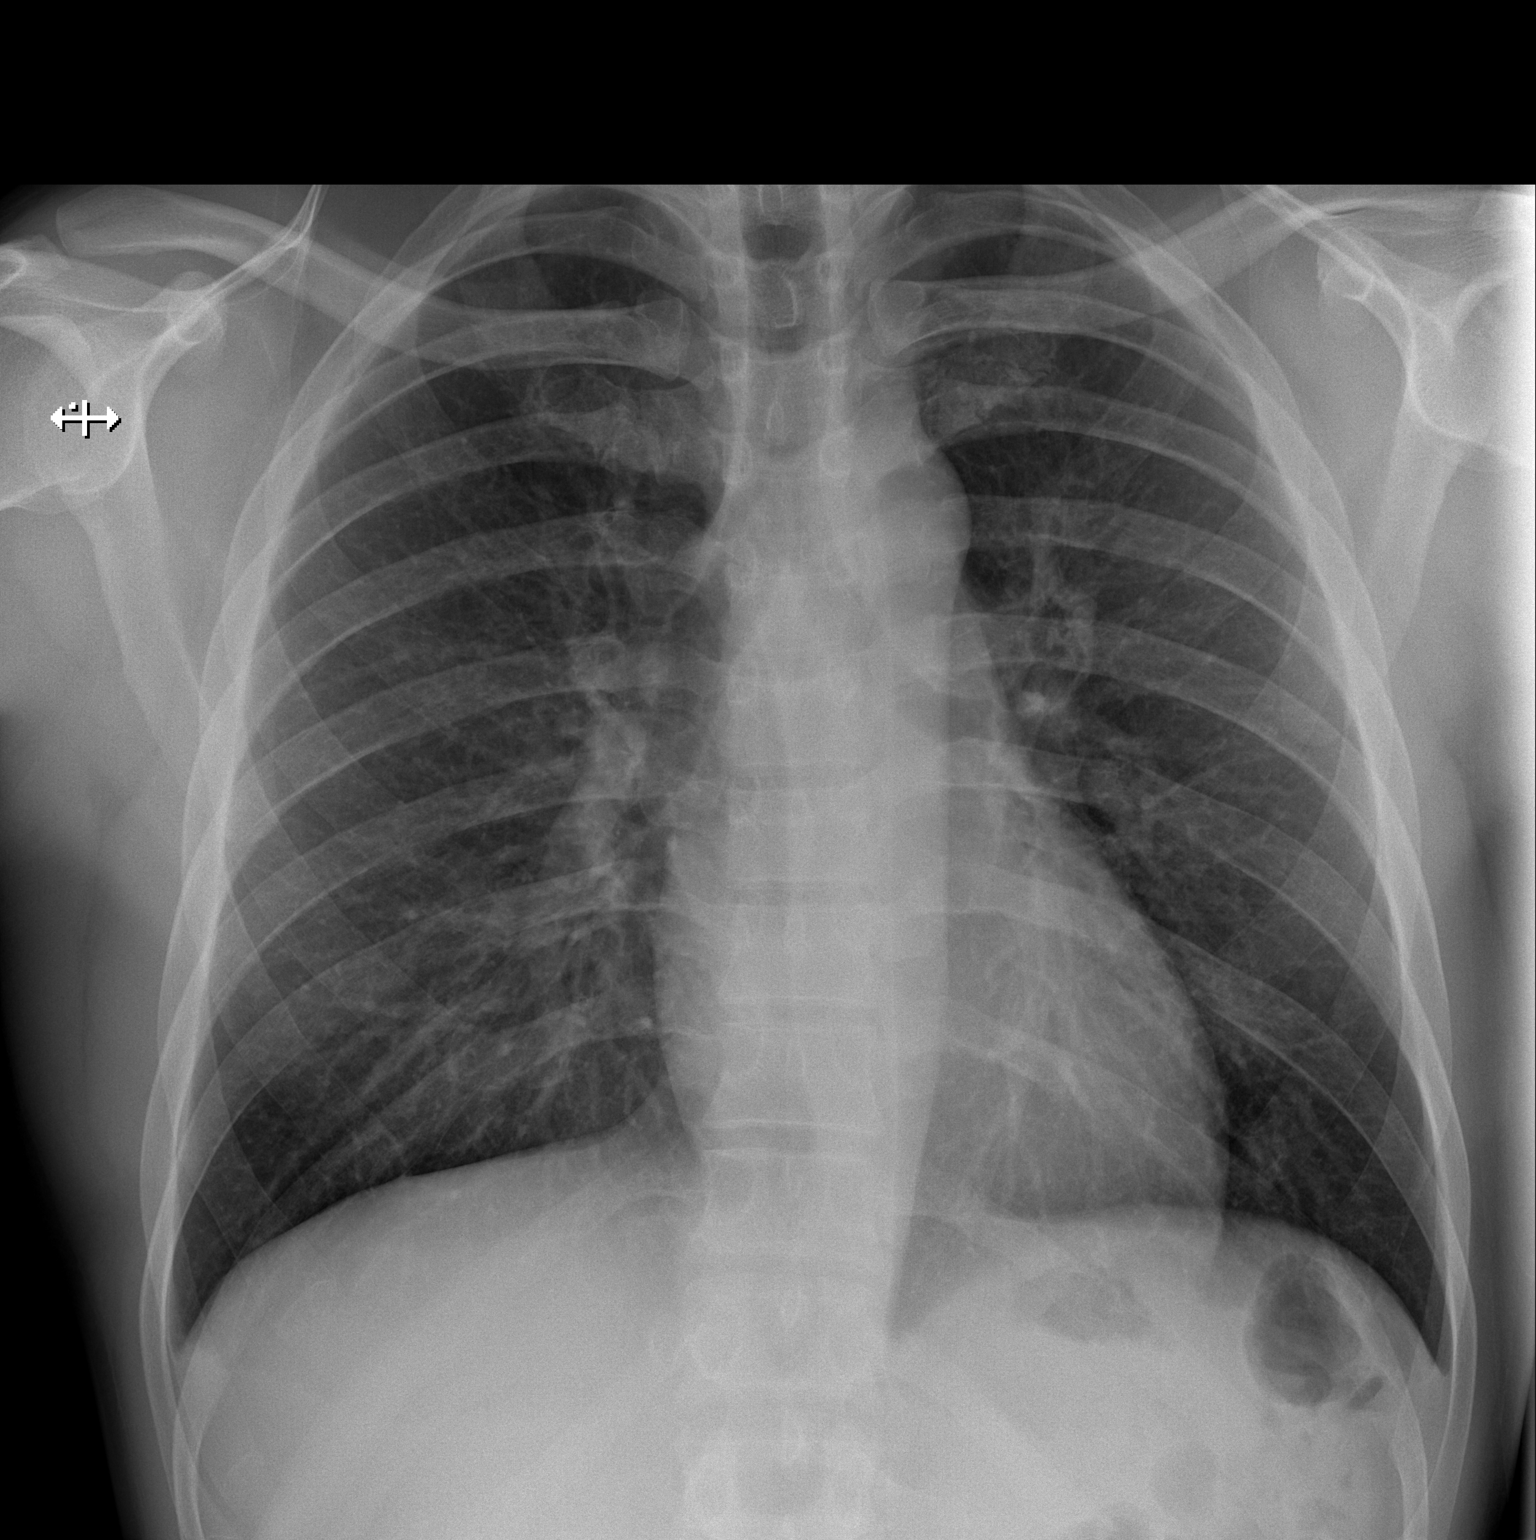

[w chest lat]
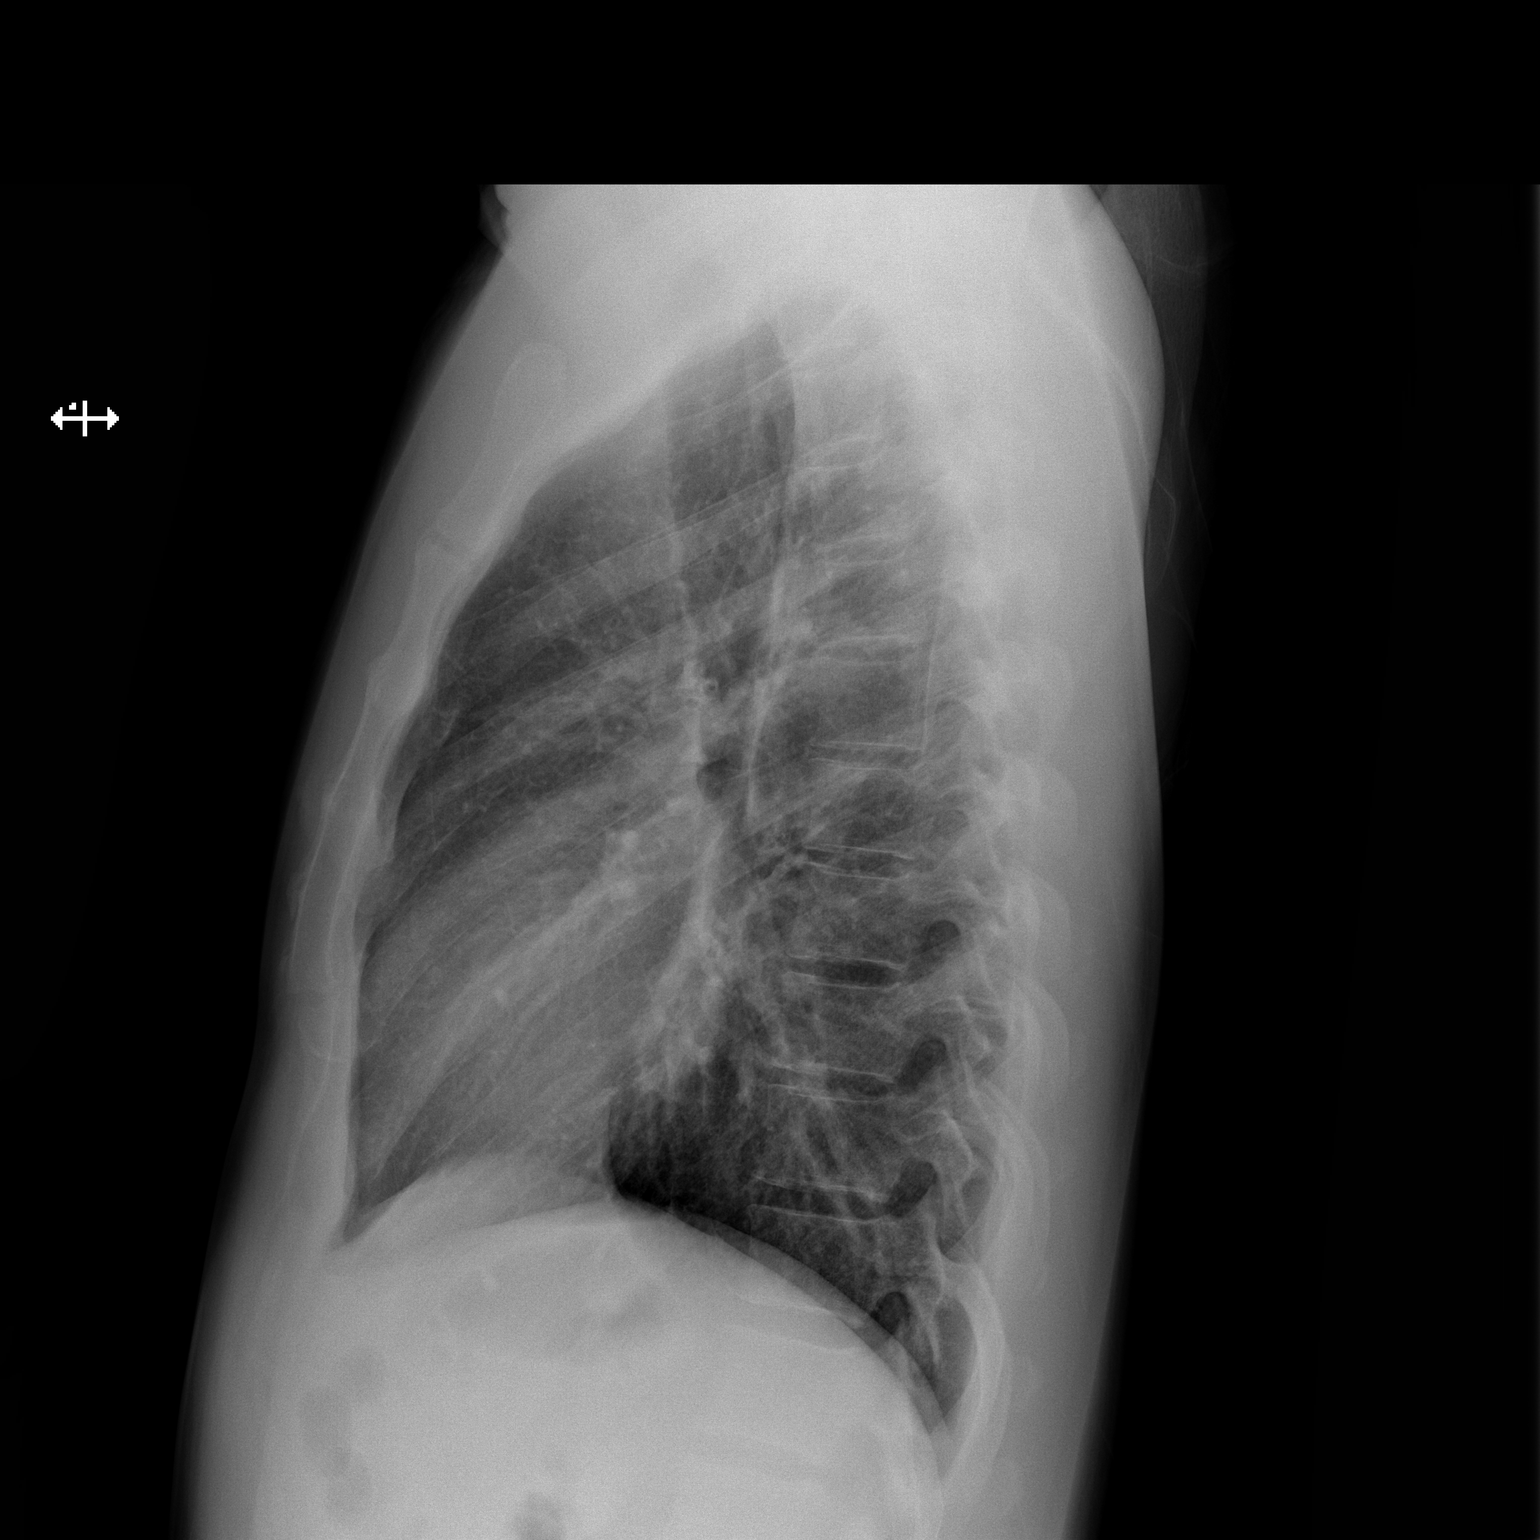

[2 of 2 positions shown; findings below may reference images not displayed]

FINDINGS: The cardiomediastinal silhouette is within normal limits. The lungs
are well inflated and clear. There is no evidence of pleural
effusion or pneumothorax. No acute osseous abnormality is
identified.
IMPRESSION: No active cardiopulmonary disease.

## 2023-06-28 ENCOUNTER — Encounter (HOSPITAL_COMMUNITY): Payer: Self-pay

## 2023-06-28 ENCOUNTER — Ambulatory Visit (HOSPITAL_COMMUNITY)
Admission: EM | Admit: 2023-06-28 | Discharge: 2023-06-28 | Disposition: A | Discharge status: Home / Self Care | Payer: Self-pay | Attending: Physician Assistant | Admitting: Physician Assistant

## 2023-06-28 DIAGNOSIS — J329 Chronic sinusitis, unspecified: Secondary | ICD-10-CM

## 2023-06-28 DIAGNOSIS — R062 Wheezing: Secondary | ICD-10-CM

## 2023-06-28 DIAGNOSIS — J40 Bronchitis, not specified as acute or chronic: Secondary | ICD-10-CM

## 2023-06-28 MED ORDER — ALBUTEROL SULFATE HFA 108 (90 BASE) MCG/ACT IN AERS
INHALATION_SPRAY | RESPIRATORY_TRACT | Status: AC
  Filled 2023-06-28: qty 6.7

## 2023-06-28 MED ORDER — ALBUTEROL SULFATE HFA 108 (90 BASE) MCG/ACT IN AERS
2.0000 | INHALATION_SPRAY | Freq: Once | RESPIRATORY_TRACT | Status: AC
  Administered 2023-06-28: 2 via RESPIRATORY_TRACT

## 2023-06-28 MED ORDER — AMOXICILLIN-POT CLAVULANATE 875-125 MG PO TABS: 1.0000 | ORAL_TABLET | Freq: Two times a day (BID) | ORAL | 0 refills | Status: DC

## 2023-06-28 MED ORDER — PREDNISONE 20 MG PO TABS: 40.0000 mg | ORAL_TABLET | Freq: Every day | ORAL | 0 refills | Status: AC

## 2023-06-28 NOTE — ED Provider Notes (Signed)
 [facility]    CSN: [id number] Arrival date & time: 06/28/23  1906      History   Chief Complaint Chief Complaint  Patient presents with   Otalgia    HPI Jeremiah Horton is a 48 y.o. male.   Patient presents today with a 2-day history of right otalgia.  Reports pain is rated 8 on a 0 to pain scale, described as pressure, no aggravating relieving factors identified.  Does report that for the past several weeks he has had some congestion and cough.  Denies any fever, chest pain, shortness of breath, nausea, vomiting.  He has tried Tylenol without improvement of symptoms.  He denies any recent airplane travel.  He does not use Q-tips or earbuds on a regular basis.  He denies any recent antibiotics or steroids.    Past Medical History:  Diagnosis Date   No pertinent past medical history     Patient Active Problem List   Diagnosis Date Noted   Disorder of teeth and supporting structures 01/22/2007    Past Surgical History:  Procedure Laterality Date   AMPUTATION  08/08/2011   Procedure: AMPUTATION DIGIT;  Surgeon: Tami Ribas, MD;  [facility];  Service: Orthopedics;  Laterality: Left;  REVISION AMPUTATION LEFT SMALL FINGER       Home Medications    Prior to Admission medications   Medication Sig Start Date End Date Taking? Authorizing Provider  amoxicillin-clavulanate (AUGMENTIN) 875-125 MG tablet Take 1 tablet by mouth every 12 (twelve) hours. 06/28/23  Yes Zimir Kittleson K, PA-C  predniSONE (DELTASONE) 20 MG tablet Take 2 tablets (40 mg total) by mouth daily for 4 days. 06/28/23 07/02/23 Yes Dayami Taitt, Noberto Retort, PA-C    Family History Family History  Problem Relation Age of Onset   Cancer Mother     Social History Social History   Tobacco Use   Smoking status: Every Day    Current packs/day: 0.25    Types: Cigarettes   Smokeless tobacco: Never  Vaping Use   Vaping status: Never Used  Substance Use Topics   Alcohol use: Yes     Comment: SOCIAL   Drug use: No     Allergies   Patient has no known allergies.   Review of Systems Review of Systems  Constitutional:  Positive for activity change. Negative for appetite change, fatigue and fever.  HENT:  Positive for congestion and ear pain. Negative for sinus pressure, sneezing and sore throat.   Respiratory:  Positive for cough. Negative for shortness of breath.   Cardiovascular:  Negative for chest pain.  Gastrointestinal:  Negative for abdominal pain, diarrhea, nausea and vomiting.  Musculoskeletal:  Negative for arthralgias and myalgias.     Physical Exam Triage Vital Signs ED Triage Vitals  Encounter Vitals Group     BP 06/28/23 1943 (!) 150/107     Systolic BP Percentile --      Diastolic BP Percentile --      Pulse Rate 06/28/23 1943 81     Resp 06/28/23 1943 18     Temp 06/28/23 1943 98.4 F (36.9 C)     Temp Source 06/28/23 1943 Oral     SpO2 06/28/23 1943 96 %     Weight --      Height --      Head Circumference --      Peak Flow --      Pain Score 06/28/23 1944 8     Pain Loc --  Pain Education --      Exclude from Growth Chart --    No data found.  Updated Vital Signs BP (!) 150/107 (BP Location: Left Arm)   Pulse 81   Temp 98.4 F (36.9 C) (Oral)   Resp 18   SpO2 96%   Visual Acuity Right Eye Distance:   Left Eye Distance:   Bilateral Distance:    Right Eye Near:   Left Eye Near:    Bilateral Near:     Physical Exam Vitals reviewed.  Constitutional:      General: He is awake.     Appearance: Normal appearance. He is well-developed. He is not ill-appearing.     Comments: Very pleasant male appears stated age in no acute distress sitting comfortably in exam room  HENT:     Head: Normocephalic and atraumatic.     Right Ear: Ear canal and external ear normal. Tympanic membrane is injected. Tympanic membrane is not erythematous or bulging.     Left Ear: Tympanic membrane, ear canal and external ear normal. Tympanic  membrane is not erythematous or bulging.     Nose:     Right Sinus: Maxillary sinus tenderness present. No frontal sinus tenderness.     Left Sinus: No maxillary sinus tenderness or frontal sinus tenderness.     Mouth/Throat:     Pharynx: Uvula midline. Postnasal drip present. No oropharyngeal exudate or uvula swelling.  Cardiovascular:     Rate and Rhythm: Normal rate and regular rhythm.     Heart sounds: Normal heart sounds, S1 normal and S2 normal. No murmur heard. Pulmonary:     Effort: Pulmonary effort is normal. No accessory muscle usage or respiratory distress.     Breath sounds: No stridor. Wheezing present. No rhonchi or rales.  Neurological:     Mental Status: He is alert.  Psychiatric:        Behavior: Behavior is cooperative.      UC Treatments / Results  Labs (all labs ordered are listed, but only abnormal results are displayed) Labs Reviewed - No data to display  EKG   Radiology No results found.  Procedures Procedures (including critical care time)  Medications Ordered in UC Medications  albuterol (VENTOLIN HFA) 108 (90 Base) MCG/ACT inhaler 2 puff (2 puffs Inhalation Given 06/28/23 2016)    Initial Impression / Assessment and Plan / UC Course  I have reviewed the triage vital signs and the nursing notes.  Pertinent labs & imaging results that were available during my care of the patient were reviewed by me and considered in my medical decision making (see chart for details).     Patient is well-appearing, afebrile, nontoxic, nontachycardic.  No indication for viral testing as he has been symptomatic for over a week and this would not change management.  I do not see evidence of otitis media on exam but I am concern for sinobronchitis contributing to symptoms and I suspect the otalgia is referred pain from his sinus.  Will start Augmentin twice daily for 7 days.  He did have significant wheezing on exam that improved following albuterol in clinic.  Given his  adventitious lung sounds resolved with albuterol x-ray was deferred but will consider this if his symptoms persist and he requires reevaluation.  He was started on prednisone 40 mg for 4 days.  We discussed that he is not to take NSAIDs with this medication.  Can use over-the-counter medication including Mucinex, Flonase, Tylenol for additional symptom relief.  Discussed  that if symptoms or not improving quickly or if he has any worsening symptoms including drainage from the ear, increasing otalgia, fever, nausea, vomiting, chest pain, worsening cough he needs to be seen emergently.  Strict return precautions given.  Excuse note provided.  Final Clinical Impressions(s) / UC Diagnoses   Final diagnoses:  Sinobronchitis  Wheezing     Discharge Instructions      We are treating you for sinobronchitis.  Start Augmentin twice daily for 7 days.  Take prednisone 40 mg for 4 days.  Do not take NSAIDs with this medication including aspirin, ibuprofen/Advil, naproxen/Aleve.  Use albuterol every 4-6 hours as needed for shortness of breath and coughing fits.  Make sure that you rest and drink plenty of fluid.  If your symptoms are not improving within a few days or if anything worsens and you have increasing ear pain, drainage from her ear, shortness of breath, worsening cough, fever you need to be seen immediately.     ED Prescriptions     Medication Sig Dispense Auth. Provider   amoxicillin-clavulanate (AUGMENTIN) 875-125 MG tablet Take 1 tablet by mouth every 12 (twelve) hours. 14 tablet Xena Propst K, PA-C   predniSONE (DELTASONE) 20 MG tablet Take 2 tablets (40 mg total) by mouth daily for 4 days. 8 tablet Versie Fleener, Noberto Retort, PA-C      PDMP not reviewed this encounter.   Jeani Hawking, PA-C 06/28/23 2027

## 2023-06-28 NOTE — Discharge Instructions (Signed)
 We are treating you for sinobronchitis.  Start Augmentin twice daily for 7 days.  Take prednisone 40 mg for 4 days.  Do not take NSAIDs with this medication including aspirin, ibuprofen/Advil, naproxen/Aleve.  Use albuterol every 4-6 hours as needed for shortness of breath and coughing fits.  Make sure that you rest and drink plenty of fluid.  If your symptoms are not improving within a few days or if anything worsens and you have increasing ear pain, drainage from her ear, shortness of breath, worsening cough, fever you need to be seen immediately.

## 2023-06-28 NOTE — ED Triage Notes (Signed)
 Pt c/o rt ear ache x2 days. States taking tylenol with no relief.

## 2023-07-17 ENCOUNTER — Encounter (HOSPITAL_COMMUNITY): Payer: Self-pay

## 2023-07-17 ENCOUNTER — Ambulatory Visit (HOSPITAL_COMMUNITY)
Admission: EM | Admit: 2023-07-17 | Discharge: 2023-07-17 | Disposition: A | Discharge status: Home / Self Care | Payer: Self-pay | Attending: Internal Medicine | Admitting: Internal Medicine

## 2023-07-17 DIAGNOSIS — K047 Periapical abscess without sinus: Secondary | ICD-10-CM

## 2023-07-17 MED ORDER — IBUPROFEN 800 MG PO TABS: 800.0000 mg | ORAL_TABLET | Freq: Three times a day (TID) | ORAL | 0 refills | Status: AC

## 2023-07-17 MED ORDER — CLINDAMYCIN HCL 300 MG PO CAPS
300.0000 mg | ORAL_CAPSULE | Freq: Three times a day (TID) | ORAL | 0 refills | Status: AC
Start: 2023-07-17 — End: 2023-07-24

## 2023-07-17 MED ORDER — IBUPROFEN 800 MG PO TABS: 800.0000 mg | ORAL_TABLET | Freq: Three times a day (TID) | ORAL | 0 refills | Status: DC

## 2023-07-17 MED ORDER — CLINDAMYCIN HCL 300 MG PO CAPS: 300.0000 mg | ORAL_CAPSULE | Freq: Three times a day (TID) | ORAL | 0 refills | Status: DC

## 2023-07-17 NOTE — ED Provider Notes (Signed)
 [facility]    CSN: [id number] Arrival date & time: 07/17/23  1930      History   Chief Complaint Chief Complaint  Patient presents with   Headache   Dental Pain   Ear Pain    HPI Jeremiah Horton is a 48 y.o. male.   Jeremiah Horton is a 48 y.o. male presenting for chief complaint of Headache, Dental Pain, and Ear Pain that started 2 days ago. Pain is most intense to the back right lower molar (tooth #32) and he states this tooth is "growing in crooked" causing pressure to the neighboring tooth and resulting in intense pain. Pain radiates to the right ear and the right head. Denies recent fevers, chills, nausea, vomiting, rash. He does not have dental insurance and does not have routine dental exams/cleanings. Recently treated for sinobronchitis with Augmentin antibiotic on 06/28/2023 (2 weeks ago), otherwise no recent antibiotics in the last 90 days. He has been taking ibuprofen for pain with some relief.    Headache Dental Pain Associated symptoms: headaches     Past Medical History:  Diagnosis Date   No pertinent past medical history     Patient Active Problem List   Diagnosis Date Noted   Disorder of teeth and supporting structures 01/22/2007    Past Surgical History:  Procedure Laterality Date   AMPUTATION  08/08/2011   Procedure: AMPUTATION DIGIT;  Surgeon: Tami Ribas, MD;  Location: [facility];  Service: Orthopedics;  Laterality: Left;  REVISION AMPUTATION LEFT SMALL FINGER       Home Medications    Prior to Admission medications   Medication Sig Start Date End Date Taking? Authorizing Provider  amoxicillin-clavulanate (AUGMENTIN) 875-125 MG tablet Take 1 tablet by mouth every 12 (twelve) hours. 06/28/23   Raspet, Noberto Retort, PA-C  clindamycin (CLEOCIN) 300 MG capsule Take 1 capsule (300 mg total) by mouth 3 (three) times daily for 7 days. 07/17/23 07/24/23  Carlisle Beers, FNP  ibuprofen (ADVIL) 800 MG tablet Take 1 tablet (800 mg  total) by mouth 3 (three) times daily. 07/17/23   Carlisle Beers, FNP    Family History Family History  Problem Relation Age of Onset   Cancer Mother     Social History Social History   Tobacco Use   Smoking status: Every Day    Current packs/day: 0.25    Types: Cigarettes   Smokeless tobacco: Never  Vaping Use   Vaping status: Never Used  Substance Use Topics   Alcohol use: Yes    Comment: SOCIAL   Drug use: No     Allergies   Patient has no known allergies.   Review of Systems Review of Systems  Neurological:  Positive for headaches.  Per HPI   Physical Exam Triage Vital Signs ED Triage Vitals  Encounter Vitals Group     BP 07/17/23 1956 (!) 142/97     Systolic BP Percentile --      Diastolic BP Percentile --      Pulse Rate 07/17/23 1956 77     Resp 07/17/23 1959 18     Temp 07/17/23 1956 98.2 F (36.8 C)     Temp Source 07/17/23 1956 Oral     SpO2 07/17/23 1956 98 %     Weight --      Height --      Head Circumference --      Peak Flow --      Pain Score --  Pain Loc --      Pain Education --      Exclude from Growth Chart --    No data found.  Updated Vital Signs BP (!) 142/97 (BP Location: Left Arm)   Pulse 77   Temp 98.2 F (36.8 C) (Oral)   Resp 18   SpO2 98%   Visual Acuity Right Eye Distance:   Left Eye Distance:   Bilateral Distance:    Right Eye Near:   Left Eye Near:    Bilateral Near:     Physical Exam Vitals and nursing note reviewed.  Constitutional:      Appearance: He is not ill-appearing or toxic-appearing.  HENT:     Head: Normocephalic and atraumatic.     Right Ear: Hearing, tympanic membrane, ear canal and external ear normal.     Left Ear: Hearing, tympanic membrane, ear canal and external ear normal.     Nose: Nose normal.     Mouth/Throat:     Lips: Pink.     Mouth: Mucous membranes are moist. No injury or oral lesions.     Dentition: Normal dentition.     Tongue: No lesions.     Pharynx:  Oropharynx is clear. Uvula midline. No pharyngeal swelling, oropharyngeal exudate, posterior oropharyngeal erythema, uvula swelling or postnasal drip.     Tonsils: No tonsillar exudate.   Eyes:     General: Lids are normal. Vision grossly intact. Gaze aligned appropriately.     Extraocular Movements: Extraocular movements intact.     Conjunctiva/sclera: Conjunctivae normal.  Neck:     Trachea: Trachea and phonation normal.  Pulmonary:     Effort: Pulmonary effort is normal.  Musculoskeletal:     Cervical back: Full passive range of motion without pain and neck supple. No spinous process tenderness.  Lymphadenopathy:     Cervical: Cervical adenopathy present.  Skin:    General: Skin is warm and dry.     Capillary Refill: Capillary refill takes less than 2 seconds.     Findings: No rash.  Neurological:     General: No focal deficit present.     Mental Status: He is alert and oriented to person, place, and time. Mental status is at baseline.     Cranial Nerves: No dysarthria or facial asymmetry.  Psychiatric:        Mood and Affect: Mood normal.        Speech: Speech normal.        Behavior: Behavior normal.        Thought Content: Thought content normal.        Judgment: Judgment normal.      UC Treatments / Results  Labs (all labs ordered are listed, but only abnormal results are displayed) Labs Reviewed - No data to display  EKG   Radiology No results found.  Procedures Procedures (including critical care time)  Medications Ordered in UC Medications - No data to display  Initial Impression / Assessment and Plan / UC Course  I have reviewed the triage vital signs and the nursing notes.  Pertinent labs & imaging results that were available during my care of the patient were reviewed by me and considered in my medical decision making (see chart for details).   1. Dental infection Evaluation suggests dental pain secondary to dental infection.   HEENT exam stable  and without red flag signs indicating need for advanced imaging/further emergent workup. Patient is afebrile, nontoxic in appearance, and with hemodynamically stable vital signs.  Antibiotic ordered (clindamycin since recently had Augmentin in the last 2 weeks).  Recommend supportive care for symptomatic relief as outlined in AVS.   Information for low cost community dental resources provided.  Encouraged to follow-up with dentist for further management.   Counseled patient on potential for adverse effects with medications prescribed/recommended today, strict ER and return-to-clinic precautions discussed, patient verbalized understanding.    Final Clinical Impressions(s) / UC Diagnoses   Final diagnoses:  Dental infection     Discharge Instructions      Your dental pain is likely due to dental infection. Take  antibiotic as prescribed for the next 7 days to treat your dental infection. Continue use of ibuprofen as needed with food for dental inflammation and pain.   You may also use tylenol as needed for pain. Perform salt water gargles every 3-4 hours.  Schedule an appointment with one of the dentists on the list provided to urgent care today.  If you develop any new or worsening symptoms or if your symptoms do not start to improve, pleases return here or follow-up with your primary care provider. If your symptoms are severe, please go to the emergency room.   ED Prescriptions     Medication Sig Dispense Auth. Provider   clindamycin (CLEOCIN) 300 MG capsule  (Status: Discontinued) Take 1 capsule (300 mg total) by mouth 3 (three) times daily for 7 days. 21 capsule Reita May M, FNP   ibuprofen (ADVIL) 800 MG tablet  (Status: Discontinued) Take 1 tablet (800 mg total) by mouth 3 (three) times daily. 21 tablet Reita May M, FNP   clindamycin (CLEOCIN) 300 MG capsule Take 1 capsule (300 mg total) by mouth 3 (three) times daily for 7 days. 21 capsule Reita May M, FNP   ibuprofen (ADVIL) 800 MG tablet Take 1 tablet (800 mg total) by mouth 3 (three) times daily. 21 tablet Carlisle Beers, FNP      PDMP not reviewed this encounter.   Carlisle Beers, [place] 07/22/23 2019

## 2023-07-17 NOTE — Discharge Instructions (Signed)
 Your dental pain is likely due to dental infection. Take  antibiotic as prescribed for the next 7 days to treat your dental infection. Continue use of ibuprofen as needed with food for dental inflammation and pain.   You may also use tylenol as needed for pain. Perform salt water gargles every 3-4 hours.  Schedule an appointment with one of the dentists on the list provided to urgent care today.  If you develop any new or worsening symptoms or if your symptoms do not start to improve, pleases return here or follow-up with your primary care provider. If your symptoms are severe, please go to the emergency room.

## 2023-07-17 NOTE — ED Triage Notes (Signed)
 Patient presents to the office for right ear pain and tooth pain x 2 days.

## 2024-03-22 ENCOUNTER — Ambulatory Visit (HOSPITAL_COMMUNITY)
Admission: EM | Admit: 2024-03-22 | Discharge: 2024-03-22 | Disposition: A | Discharge status: Home / Self Care | Payer: Self-pay | Attending: Emergency Medicine | Admitting: Emergency Medicine

## 2024-03-22 ENCOUNTER — Encounter (HOSPITAL_COMMUNITY): Payer: Self-pay | Admitting: Emergency Medicine

## 2024-03-22 DIAGNOSIS — H109 Unspecified conjunctivitis: Secondary | ICD-10-CM

## 2024-03-22 DIAGNOSIS — B9689 Other specified bacterial agents as the cause of diseases classified elsewhere: Secondary | ICD-10-CM

## 2024-03-22 DIAGNOSIS — R030 Elevated blood-pressure reading, without diagnosis of hypertension: Secondary | ICD-10-CM

## 2024-03-22 MED ORDER — MOXIFLOXACIN HCL 0.5 % OP SOLN: 1.0000 [drp] | Freq: Three times a day (TID) | OPHTHALMIC | 0 refills | Status: AC

## 2024-03-22 NOTE — ED Provider Notes (Signed)
 [facility]    CSN: [id number] Arrival date & time: 03/22/24  1724      History   Chief Complaint Chief Complaint  Patient presents with   Eye Pain    bilateral    HPI Jeremiah Horton is a 48 y.o. male.   Patient presents with bilateral eye redness, discomfort, and purulent drainage that began approximately 2 days ago.  Patient reports that he does have small children and is concerned that he may have pinkeye from them.  Patient denies wearing contacts patient denies any recent injuries to his eyes.  Of note patient's blood pressure is elevated in clinic today.  Patient states that his blood pressure is often elevated when going to the doctor and states that he is not concerned about this at this time.  The history is provided by the patient and medical records.  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 Authorizing Provider  moxifloxacin  (VIGAMOX ) 0.5 % ophthalmic solution Place 1 drop into both eyes 3 (three) times daily. 03/22/24  Yes Johnie, Jionni Helming A, NP  ibuprofen  (ADVIL ) 800 MG tablet Take 1 tablet (800 mg total) by mouth 3 (three) times daily. 07/17/23   Enedelia Dorna HERO, FNP    Family History Family History  Problem Relation Age of Onset   Cancer Mother     Social History Social History   Tobacco Use   Smoking status: Every Day    Current packs/day: 0.25    Types: Cigarettes   Smokeless tobacco: Never  Vaping Use    Vaping status: Never Used  Substance Use Topics   Alcohol use: Yes    Comment: SOCIAL   Drug use: No     Allergies   Patient has no known allergies.   Review of Systems Review of Systems  Eyes:  Positive for pain.   Per HPI  Physical Exam Triage Vital Signs ED Triage Vitals  Encounter Vitals Group     BP 03/22/24 1856 (!) 145/101     Girls Systolic BP Percentile --      Girls Diastolic BP Percentile --      Boys Systolic BP Percentile --      Boys Diastolic BP Percentile --      Pulse Rate 03/22/24 1856 73     Resp 03/22/24 1856 16     Temp 03/22/24 1856 98.8 F (37.1 C)     Temp Source 03/22/24 1856 Oral     SpO2 03/22/24 1856 98 %     Weight --      Height --      Head Circumference --      Peak Flow --      Pain Score 03/22/24 1854 5     Pain Loc --      Pain Education --      Exclude from Growth Chart --    No data found.  Updated Vital  Signs BP (!) 142/101 (BP Location: Left Arm)   Pulse 73   Temp 98.8 F (37.1 C) (Oral)   Resp 16   SpO2 98%   Visual Acuity Right Eye Distance:   Left Eye Distance:   Bilateral Distance:    Right Eye Near:   Left Eye Near:    Bilateral Near:     Physical Exam Vitals and nursing note reviewed.  Constitutional:      General: He is awake. He is not in acute distress.    Appearance: Normal appearance. He is well-developed and well-groomed. He is not ill-appearing.  Eyes:     General:        Right eye: Discharge present.        Left eye: Discharge present.    Conjunctiva/sclera:     Right eye: Right conjunctiva is injected. Exudate present.     Left eye: Left conjunctiva is injected. Exudate present.     Comments: Bilateral conjunctiva are injected and there is purulent drainage noted from both eyes  Skin:    General: Skin is warm and dry.  Neurological:     Mental Status: He is alert.  Psychiatric:        Behavior: Behavior is cooperative.      UC Treatments / Results  Labs (all labs ordered are  listed, but only abnormal results are displayed) Labs Reviewed - No data to display  EKG   Radiology No results found.  Procedures Procedures (including critical care time)  Medications Ordered in UC Medications - No data to display  Initial Impression / Assessment and Plan / UC Course  I have reviewed the triage vital signs and the nursing notes.  Pertinent labs & imaging results that were available during my care of the patient were reviewed by me and considered in my medical decision making (see chart for details).     Patient is overall well-appearing.  Vitals are stable.  Exam findings consistent with bacterial conjunctivitis.  Prescribed Vigamox eyedrops for this.  Discussed follow-up and return precautions. Final Clinical Impressions(s) / UC Diagnoses   Final diagnoses:  Bacterial conjunctivitis of both eyes  Elevated blood pressure reading     Discharge Instructions      Apply Vigamox eyedrops 3 times daily for 5 days for coverage of bacterial conjunctivitis. Be sure to wash your hands frequently especially after messing with your eyes as this can be contagious. Follow-up with your primary care provider or return here as needed.   ED Prescriptions     Medication Sig Dispense Auth. Provider   moxifloxacin (VIGAMOX) 0.5 % ophthalmic solution Place 1 drop into both eyes 3 (three) times daily. 3 mL Johnie Flaming A, NP      PDMP not reviewed this encounter.   Johnie Flaming A, NP 03/22/24 1920

## 2024-03-22 NOTE — ED Triage Notes (Signed)
 Bilateral eye pain and redness for two days,  patient has not taken anything for it

## 2024-03-22 NOTE — Discharge Instructions (Addendum)
 Apply Vigamox eyedrops 3 times daily for 5 days for coverage of bacterial conjunctivitis. Be sure to wash your hands frequently especially after messing with your eyes as this can be contagious. Follow-up with your primary care provider or return here as needed.

## 2024-03-27 ENCOUNTER — Encounter: Payer: Self-pay | Admitting: Emergency Medicine

## 2024-03-27 ENCOUNTER — Ambulatory Visit
Admission: EM | Admit: 2024-03-27 | Discharge: 2024-03-27 | Disposition: A | Discharge status: Home / Self Care | Payer: Self-pay

## 2024-03-27 DIAGNOSIS — J069 Acute upper respiratory infection, unspecified: Secondary | ICD-10-CM

## 2024-03-27 LAB — POC COVID19/FLU A&B COMBO
Covid Antigen, POC: NEGATIVE
Influenza A Antigen, POC: NEGATIVE
Influenza B Antigen, POC: NEGATIVE

## 2024-03-27 LAB — POCT RAPID STREP A (OFFICE): Rapid Strep A Screen: NEGATIVE

## 2024-03-27 MED ORDER — ACETAMINOPHEN 325 MG PO TABS
975.0000 mg | ORAL_TABLET | Freq: Once | ORAL | Status: AC
  Administered 2024-03-27: 975 mg via ORAL

## 2024-03-27 NOTE — Discharge Instructions (Addendum)
 Neg for covid, flu, and strep  You been diagnosed with a viral illness today. -Viruses have to run their course and medicines that are prescribed are meant to help with symptoms. - With viruses usually feel poorly from 3 to 7 days with cough being the last symptoms to resolve.  -Cough can linger from days to weeks.  Antibiotics are not effective for viruses. -If your cough lasts more than 2 weeks and you are coughing so hard that you are vomiting or feel like you could pass out we need to follow-up with PCP for further testing and evaluation. -Rest, increase water intake, may use pseudoephedrine for nasal congestion, Delsym (dextromethorphan) or honey as needed for cough, and ibuprofen  and/or Tylenol  as directed on packaging for pain and fever. -If you have hypertension you should take Coricidin or other OTC meds approved for people with high blood pressure. -You may use a spoonful of honey every 4-6 hours as needed for throat pain and cough. -Warm tea with honey and lemon are helpful for soothe throat as well.  Chloraseptic and Cepacol make a throat lozenge with numbing medication, can be purchased over-the-counter. -May also use Flonase or sinus rinse for sinus pressure or nasal congestion.  Be sure to use distilled bottled water for sinus rinses. -May use coolmist humidifier to open up nasal passages -May elevate head to assist with postnasal drainage. -If you feel poorly (fever, fatigue, shortness of breath, nausea, etc.) for more than 10 days to be sure to follow-up with PCP or in clinic for further evaluation and additional treatments. If you experience chest pain with shortness of breath or pulse oxygen less than 95% you should report to the ER.

## 2024-03-27 NOTE — ED Triage Notes (Signed)
 Pt presents c/o sore throat x 3 days. Pt states,  Man my throat hurt real bad when I swallow. I can feel it in my ears and everything.  Pt denies emesis but does c/o diarrhea.

## 2024-03-27 NOTE — ED Provider Notes (Signed)
 [facility]    CSN: [id number] Arrival date & time: 03/27/24  1811      History   Chief Complaint Chief Complaint  Patient presents with   Sore Throat   Cough   Fatigue   Generalized Body Aches    HPI Jeremiah Horton is a 48 y.o. male.   Pt presents today due to 3 days worth of nasal congestion, throat pain, body aches, reduced appetite, diarrhea, and fever (100.7 found during triage).  Patient states that his children were recently sick but states that their symptoms were not similar to his.  Patient states that he took liquid Tylenol  this morning for symptoms and 2 days ago he took DayQuil denies any significant relief.  The history is provided by the patient.  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 Authorizing Provider  ibuprofen  (ADVIL ) 800 MG tablet Take 1 tablet (800 mg total) by mouth 3 (three) times daily. 07/17/23   Enedelia Dorna HERO, FNP  moxifloxacin (VIGAMOX) 0.5 % ophthalmic solution Place 1 drop into both eyes 3 (three) times daily. 03/22/24   Johnie Rumaldo LABOR, NP    Family History Family History  Problem Relation Age of Onset   Cancer Mother     Social History Social History   Tobacco Use   Smoking status: Every Day    Current packs/day: 0.25    Types: Cigarettes    Passive exposure: Current   Smokeless tobacco: Never  Vaping Use   Vaping status: Never Used  Substance Use Topics   Alcohol use:  Yes    Comment: SOCIAL   Drug use: No     Allergies   Patient has no known allergies.   Review of Systems Review of Systems  Respiratory:  Positive for cough.      Physical Exam Triage Vital Signs ED Triage Vitals  Encounter Vitals Group     BP 03/27/24 1844 (!) 152/98     Girls Systolic BP Percentile --      Girls Diastolic BP Percentile --      Boys Systolic BP Percentile --      Boys Diastolic BP Percentile --      Pulse Rate 03/27/24 1844 86     Resp 03/27/24 1844 16     Temp 03/27/24 1844 (!) 100.7 F (38.2 C)     Temp Source 03/27/24 1844 Oral     SpO2 03/27/24 1844 97 %     Weight 03/27/24 1843 95 lb 0.3 oz (43.1 kg)     Height --      Head Circumference --      Peak Flow --      Pain Score 03/27/24 1843 0     Pain Loc --      Pain Education --      Exclude from Growth Chart --    No data found.  Updated Vital Signs BP ROLLEN)  152/98 (BP Location: Left Arm)   Pulse 86   Temp (!) 100.7 F (38.2 C) (Oral)   Resp 16   Wt 95 lb 0.3 oz (43.1 kg)   SpO2 97%   BMI 12.89 kg/m   Visual Acuity Right Eye Distance:   Left Eye Distance:   Bilateral Distance:    Right Eye Near:   Left Eye Near:    Bilateral Near:     Physical Exam Vitals and nursing note reviewed.  Constitutional:      General: He is not in acute distress.    Appearance: Normal appearance. He is not ill-appearing, toxic-appearing or diaphoretic.  HENT:     Nose: Congestion (moderately enlarged turbinates) present. No rhinorrhea.     Mouth/Throat:     Mouth: Mucous membranes are moist.     Pharynx: Oropharynx is clear. No oropharyngeal exudate or posterior oropharyngeal erythema.  Eyes:     General: No scleral icterus. Cardiovascular:     Rate and Rhythm: Normal rate and regular rhythm.     Heart sounds: Normal heart sounds.  Pulmonary:     Effort: Pulmonary effort is normal. No respiratory distress.     Breath sounds: Normal breath sounds. No wheezing or rhonchi.  Skin:     General: Skin is warm.  Neurological:     Mental Status: He is alert and oriented to person, place, and time.  Psychiatric:        Mood and Affect: Mood normal.        Behavior: Behavior normal.      UC Treatments / Results  Labs (all labs ordered are listed, but only abnormal results are displayed) Labs Reviewed  POCT RAPID STREP A (OFFICE) - Normal  POC COVID19/FLU A&B COMBO    EKG   Radiology No results found.  Procedures Procedures (including critical care time)  Medications Ordered in UC Medications  acetaminophen  (TYLENOL ) tablet 975 mg (975 mg Oral Given 03/27/24 1921)    Initial Impression / Assessment and Plan / UC Course  I have reviewed the triage vital signs and the nursing notes.  Pertinent labs & imaging results that were available during my care of the patient were reviewed by me and considered in my medical decision making (see chart for details).   Final Clinical Impressions(s) / UC Diagnoses   Final diagnoses:  None   Discharge Instructions   None    ED Prescriptions   None    PDMP not reviewed this encounter.   Andra Corean BROCKS, PA-C 03/27/24 1951
